# Patient Record
Sex: Male | Born: 1995 | Race: White | Hispanic: No | Smoking: Light tobacco smoker
Health system: Southern US, Community
[De-identification: ages and names within clinical notes are randomized; demographics above are authoritative.]

---
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
## 2018-01-17 ENCOUNTER — Other Ambulatory Visit: Payer: Self-pay

## 2018-01-17 ENCOUNTER — Emergency Department (HOSPITAL_COMMUNITY)
Admission: EM | Admit: 2018-01-17 | Discharge: 2018-01-17 | Disposition: A | Discharge status: Home / Self Care | Payer: BLUE CROSS/BLUE SHIELD | Attending: Emergency Medicine | Admitting: Emergency Medicine

## 2018-01-17 ENCOUNTER — Encounter (HOSPITAL_COMMUNITY): Payer: Self-pay | Admitting: Emergency Medicine

## 2018-01-17 DIAGNOSIS — Y929 Unspecified place or not applicable: Secondary | ICD-10-CM | POA: Diagnosis not present

## 2018-01-17 DIAGNOSIS — Y939 Activity, unspecified: Secondary | ICD-10-CM | POA: Insufficient documentation

## 2018-01-17 DIAGNOSIS — Z720 Tobacco use: Secondary | ICD-10-CM | POA: Insufficient documentation

## 2018-01-17 DIAGNOSIS — S61211A Laceration without foreign body of left index finger without damage to nail, initial encounter: Secondary | ICD-10-CM | POA: Diagnosis present

## 2018-01-17 DIAGNOSIS — W269XXA Contact with unspecified sharp object(s), initial encounter: Secondary | ICD-10-CM | POA: Diagnosis not present

## 2018-01-17 DIAGNOSIS — Y999 Unspecified external cause status: Secondary | ICD-10-CM | POA: Diagnosis not present

## 2018-01-17 DIAGNOSIS — Z23 Encounter for immunization: Secondary | ICD-10-CM | POA: Diagnosis not present

## 2018-01-17 MED ORDER — BACITRACIN-NEOMYCIN-POLYMYXIN 400-5-5000 EX OINT
TOPICAL_OINTMENT | Freq: Once | CUTANEOUS | Status: AC
Start: 2018-01-17 — End: 2018-01-17
  Administered 2018-01-17: 1 via TOPICAL
  Filled 2018-01-17: qty 1

## 2018-01-17 MED ORDER — TETANUS-DIPHTH-ACELL PERTUSSIS 5-2.5-18.5 LF-MCG/0.5 IM SUSP
0.5000 mL | Freq: Once | INTRAMUSCULAR | Status: AC
  Administered 2018-01-17: 0.5 mL via INTRAMUSCULAR
  Filled 2018-01-17: qty 0.5

## 2018-01-17 NOTE — Discharge Instructions (Addendum)
Please cleanse the wound daily with soap and water.  Apply Neosporin and a Band-Aid daily until the wound heals.  Your tetanus status was updated today.  Please update your medical records to indicate this tetanus status change.

## 2018-01-17 NOTE — ED Triage Notes (Signed)
Patient states he cut his finger (L middle) on a rusty piece of metal while working on a vehicle.

## 2018-01-18 NOTE — ED Provider Notes (Signed)
[facility]GENCY [facility]PARTMENT Provider Note   CSN: [id number] Arrival date & time: 01/17/18  1933     History   Chief Complaint Chief Complaint  Patient presents with  . Laceration    HPI Tony Mercado is a 22 y.o. male.  The history is provided by the patient.  Laceration   The incident occurred 3 to 5 hours ago. The laceration is located on the left hand (left middle finger). The laceration is 1 cm in size. Injury mechanism: rusty metal on old car. The pain is mild. The pain has been constant since onset. He reports no foreign bodies present. His tetanus status is out of date.    History reviewed. No pertinent past medical history.  There are no active problems to display for this patient.   History reviewed. No pertinent surgical history.      Home Medications    Prior to Admission medications   Medication Sig Start Date End Date Taking? Authorizing Provider  doxycycline (VIBRAMYCIN) 100 MG capsule Take 1 capsule (100 mg total) by mouth 2 (two) times daily. 07/11/15   Elson AreasSofia, Leslie K, PA-C    Family History History reviewed. No pertinent family history.  Social History Social History   Tobacco Use  . Smoking status: Light Tobacco Smoker    Packs/day: 0.25    Types: Cigarettes  . Smokeless tobacco: Never Used  Substance Use Topics  . Alcohol use: No  . Drug use: No     Allergies   Patient has no known allergies.   Review of Systems Review of Systems  Constitutional: Negative for activity change.       All ROS Neg except as noted in HPI  HENT: Negative for nosebleeds.   Eyes: Negative for photophobia and discharge.  Respiratory: Negative for cough, shortness of breath and wheezing.   Cardiovascular: Negative for chest pain and palpitations.  Gastrointestinal: Negative for abdominal pain and blood in stool.  Genitourinary: Negative for dysuria, frequency and hematuria.  Musculoskeletal: Negative for arthralgias, back pain and neck pain.    Skin: Negative.   Neurological: Negative for dizziness, seizures and speech difficulty.  Psychiatric/Behavioral: Negative for confusion and hallucinations.     Physical Exam Updated Vital Signs BP 138/74 (BP Location: Right Arm)   Pulse 67   Temp 97.9 F (36.6 C) (Oral)   Resp 18   Ht 6\' 1"  (1.854 m)   Wt 64.1 kg   SpO2 100%   BMI 18.65 kg/m   Physical Exam  Constitutional: He is oriented to person, place, and time. He appears well-developed and well-nourished.  Non-toxic appearance.  HENT:  Head: Normocephalic.  Right Ear: Tympanic membrane and external ear normal.  Left Ear: Tympanic membrane and external ear normal.  Eyes: Pupils are equal, round, and reactive to light. EOM and lids are normal.  Neck: Normal range of motion. Neck supple. Carotid bruit is not present.  Cardiovascular: Normal rate, regular rhythm, normal heart sounds, intact distal pulses and normal pulses.  Pulmonary/Chest: Breath sounds normal. No respiratory distress.  Abdominal: Soft. Bowel sounds are normal. There is no tenderness. There is no guarding.  Musculoskeletal: Normal range of motion.       Left hand: He exhibits tenderness and laceration. He exhibits normal range of motion, normal capillary refill and no deformity. Normal sensation noted. Normal strength noted.       Hands: Left middle finger has full flexion and extension. Good cap refill. Laceration is shallow. No sutures required.   Lymphadenopathy:  Head (right side): No submandibular adenopathy present.       Head (left side): No submandibular adenopathy present.    He has no cervical adenopathy.  Neurological: He is alert and oriented to person, place, and time. He has normal strength. No cranial nerve deficit or sensory deficit.  Skin: Skin is warm and dry.  Psychiatric: He has a normal mood and affect. His speech is normal.  Nursing note and vitals reviewed.    ED Treatments / Results  Labs (all labs ordered are listed,  but only abnormal results are displayed) Labs Reviewed - No data to display  EKG None  Radiology No results found.  Procedures Procedures (including critical care time)  Medications Ordered in ED Medications  neomycin-bacitracin-polymyxin (NEOSPORIN) ointment (1 application Topical Given 01/17/18 2127)  Tdap (BOOSTRIX) injection 0.5 mL (0.5 mLs Intramuscular Given 01/17/18 2127)     Initial Impression / Assessment and Plan / ED Course  I have reviewed the triage vital signs and the nursing notes.  Pertinent labs & imaging results that were available during my care of the patient were reviewed by me and considered in my medical decision making (see chart for details).       Final Clinical Impressions(s) / ED Diagnoses MDM  Vital signs stable. No neurovascular deficit noted of the left hand or middle finger. Tetanus updated. Wound cleansed and bandage applied. Pt to return If any sign of advancing infection.   Final diagnoses:  Laceration of left index finger without foreign body without damage to nail, initial encounter    ED Discharge Orders    None       Ivery Quale, PA-C 01/18/18 1137    Doug Sou, MD 01/20/18 0700

## 2018-03-06 ENCOUNTER — Emergency Department (HOSPITAL_COMMUNITY)
Admission: EM | Admit: 2018-03-06 | Discharge: 2018-03-06 | Disposition: A | Discharge status: Home / Self Care | Payer: BLUE CROSS/BLUE SHIELD | Attending: Emergency Medicine | Admitting: Emergency Medicine

## 2018-03-06 ENCOUNTER — Encounter (HOSPITAL_COMMUNITY): Payer: Self-pay

## 2018-03-06 ENCOUNTER — Other Ambulatory Visit: Payer: Self-pay

## 2018-03-06 DIAGNOSIS — R42 Dizziness and giddiness: Secondary | ICD-10-CM | POA: Insufficient documentation

## 2018-03-06 DIAGNOSIS — R55 Syncope and collapse: Secondary | ICD-10-CM

## 2018-03-06 DIAGNOSIS — Z720 Tobacco use: Secondary | ICD-10-CM | POA: Insufficient documentation

## 2018-03-06 LAB — I-STAT CHEM 8, ED
BUN: 17 mg/dL (ref 6–20)
CALCIUM ION: 1.23 mmol/L (ref 1.15–1.40)
Chloride: 101 mmol/L (ref 98–111)
Creatinine, Ser: 1 mg/dL (ref 0.61–1.24)
Glucose, Bld: 83 mg/dL (ref 70–99)
HCT: 47 % (ref 39.0–52.0)
HEMOGLOBIN: 16 g/dL (ref 13.0–17.0)
Potassium: 4 mmol/L (ref 3.5–5.1)
SODIUM: 139 mmol/L (ref 135–145)
TCO2: 29 mmol/L (ref 22–32)

## 2018-03-06 LAB — CBG MONITORING, ED: GLUCOSE-CAPILLARY: 80 mg/dL (ref 70–99)

## 2018-03-06 NOTE — Discharge Instructions (Addendum)
Your EKG and lab work are normal here without indication of diabetes Please establish primary care and recheck in the next 1 to 2 weeks

## 2018-03-06 NOTE — ED Triage Notes (Addendum)
Pt has been having near syncopal episodes with nausea x one month. Frequent urination x 3 days. Pt says he "gets really hyper when he eats sugar, then comes down and gets really tired". Pt says his family thinks he is diabetic. Pt has not seen PCP for this.

## 2018-03-06 NOTE — ED Provider Notes (Addendum)
[facility] Provider Note   CSN: [id number] Arrival date & time: 03/06/18  2154     History   Chief Complaint Chief Complaint  Patient presents with  . Near Syncope    HPI Franz DellBrandon Szczesniak is a 22 y.o. male.  HPI  22 year old male previously healthy presents today stating that he has some episodes of weakness and lightheadedness.  He thinks it may be he is diabetic. Friend at bedside states that sometimes he feels somewhat lightheaded and appears to be staring off into space.Marland Kitchen.  He states that he is aware of these episodes.  No report of seizure activity, weight change, appetite change, headache, head injury, chest pain, dyspnea, nausea, vomiting, or diaphoresis  History reviewed. No pertinent past medical history.  There are no active problems to display for this patient.   History reviewed. No pertinent surgical history.      Home Medications    Prior to Admission medications   Medication Sig Start Date End Date Taking? Authorizing Provider  doxycycline (VIBRAMYCIN) 100 MG capsule Take 1 capsule (100 mg total) by mouth 2 (two) times daily. 07/11/15   Elson AreasSofia, Leslie K, PA-C    Family History No family history on file.  Social History Social History   Tobacco Use  . Smoking status: Light Tobacco Smoker    Packs/day: 0.25    Types: Cigarettes  . Smokeless tobacco: Never Used  Substance Use Topics  . Alcohol use: No  . Drug use: No     Allergies   Patient has no known allergies.   Review of Systems Review of Systems  All other systems reviewed and are negative.    Physical Exam Updated Vital Signs BP (!) 117/93 (BP Location: Right Arm)   Pulse 67   Temp 97.7 F (36.5 C) (Oral)   Resp 16   Ht 1.829 m (6')   Wt 63.5 kg   SpO2 100%   BMI 18.99 kg/m   Physical Exam Vitals signs and nursing note reviewed.  HENT:     Head: Normocephalic.     Right Ear: Tympanic membrane normal.     Left Ear: Tympanic membrane normal.   Nose: Nose normal.     Mouth/Throat:     Mouth: Mucous membranes are moist.  Eyes:     Pupils: Pupils are equal, round, and reactive to light.  Neck:     Musculoskeletal: Normal range of motion and neck supple.  Cardiovascular:     Rate and Rhythm: Normal rate and regular rhythm.     Pulses: Normal pulses.     Heart sounds: Normal heart sounds.  Pulmonary:     Effort: Pulmonary effort is normal.  Abdominal:     General: Abdomen is flat.     Palpations: Abdomen is soft.  Musculoskeletal: Normal range of motion.  Skin:    General: Skin is warm and dry.     Capillary Refill: Capillary refill takes less than 2 seconds.  Neurological:     General: No focal deficit present.     Mental Status: He is alert. Mental status is at baseline. He is disoriented.  Psychiatric:        Mood and Affect: Mood normal.      ED Treatments / Results  Labs (all labs ordered are listed, but only abnormal results are displayed) Labs Reviewed  CBG MONITORING, ED    EKG EKG Interpretation  Date/Time:  Tuesday March 06 2018 22:33:29 EST Ventricular Rate:  61 PR Interval:  QRS Duration: 99 QT Interval:  384 QTC Calculation: 387 R Axis:   62 Text Interpretation:  Normal sinus rhythm Normal ECG Confirmed by Margarita Grizzleay, Daune Colgate [id number]) on 03/06/2018 10:44:55 PM   Radiology No results found.  Procedures Procedures (including critical care time)  Medications Ordered in ED Medications - No data to display   Initial Impression / Assessment and Plan / ED Course  I have reviewed the triage vital signs and the nursing notes.  Pertinent labs & imaging results that were available during my care of the patient were reviewed by me and considered in my medical decision making (see chart for details).    Plan EKG, orthostatics, i-STAT 8 Test normal plan discharged to follow-up with primary care Final Clinical Impressions(s) / ED Diagnoses   Final diagnoses:  Syncope, unspecified syncope type     ED Discharge Orders    None       Margarita Grizzleay, Nello Corro, MD 03/06/18 2245    Margarita Grizzleay, Fredi Geiler, MD 03/06/18 2247

## 2019-08-23 ENCOUNTER — Emergency Department (HOSPITAL_COMMUNITY)
Admission: EM | Admit: 2019-08-23 | Discharge: 2019-08-24 | Disposition: A | Discharge status: Home / Self Care | Payer: BC Managed Care – PPO | Attending: Emergency Medicine | Admitting: Emergency Medicine

## 2019-08-23 ENCOUNTER — Encounter (HOSPITAL_COMMUNITY): Payer: Self-pay

## 2019-08-23 ENCOUNTER — Emergency Department (HOSPITAL_COMMUNITY): Payer: BC Managed Care – PPO

## 2019-08-23 DIAGNOSIS — Z5321 Procedure and treatment not carried out due to patient leaving prior to being seen by health care provider: Secondary | ICD-10-CM | POA: Diagnosis not present

## 2019-08-23 DIAGNOSIS — Y9389 Activity, other specified: Secondary | ICD-10-CM | POA: Insufficient documentation

## 2019-08-23 DIAGNOSIS — Y92410 Unspecified street and highway as the place of occurrence of the external cause: Secondary | ICD-10-CM | POA: Diagnosis not present

## 2019-08-23 DIAGNOSIS — M545 Low back pain: Secondary | ICD-10-CM | POA: Diagnosis present

## 2019-08-23 DIAGNOSIS — Y999 Unspecified external cause status: Secondary | ICD-10-CM | POA: Insufficient documentation

## 2019-08-23 LAB — URINALYSIS, ROUTINE W REFLEX MICROSCOPIC
Bilirubin Urine: NEGATIVE
Glucose, UA: NEGATIVE mg/dL
Hgb urine dipstick: NEGATIVE
Ketones, ur: NEGATIVE mg/dL
Leukocytes,Ua: NEGATIVE
Nitrite: NEGATIVE
Protein, ur: 30 mg/dL — AB
Specific Gravity, Urine: 1.031 — ABNORMAL HIGH (ref 1.005–1.030)
pH: 5 (ref 5.0–8.0)
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
## 2019-08-24 NOTE — ED Notes (Signed)
Pt did not to wait and left.

## 2019-08-26 ENCOUNTER — Ambulatory Visit (HOSPITAL_COMMUNITY)
Admission: EM | Admit: 2019-08-26 | Discharge: 2019-08-26 | Disposition: A | Discharge status: Home / Self Care | Payer: BC Managed Care – PPO | Attending: Family Medicine | Admitting: Family Medicine

## 2019-08-26 ENCOUNTER — Other Ambulatory Visit: Payer: Self-pay

## 2019-08-26 ENCOUNTER — Encounter (HOSPITAL_COMMUNITY): Payer: Self-pay

## 2019-08-26 DIAGNOSIS — S39012A Strain of muscle, fascia and tendon of lower back, initial encounter: Secondary | ICD-10-CM

## 2019-08-26 MED ORDER — CYCLOBENZAPRINE HCL 5 MG PO TABS: 5.0000 mg | ORAL_TABLET | Freq: Two times a day (BID) | ORAL | 0 refills | Status: AC | PRN

## 2019-08-26 MED ORDER — IBUPROFEN 100 MG/5ML PO SUSP: 600.0000 mg | Freq: Three times a day (TID) | ORAL | 0 refills | Status: DC | PRN
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
## 2019-08-26 NOTE — Discharge Instructions (Addendum)
Use anti-inflammatories for pain/swelling. You may take up to 800 mg Ibuprofen every 8 hours with food. You may supplement Ibuprofen with Tylenol 500-1000 mg every 8 hours.  Gentle stretching Alternate ice and heat Follow up if not improving or worsening 
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
## 2019-08-29 ENCOUNTER — Other Ambulatory Visit: Payer: Self-pay

## 2019-08-29 ENCOUNTER — Ambulatory Visit (HOSPITAL_COMMUNITY)
Admission: EM | Admit: 2019-08-29 | Discharge: 2019-08-29 | Disposition: A | Discharge status: Home / Self Care | Payer: BC Managed Care – PPO | Attending: Family Medicine | Admitting: Family Medicine

## 2019-08-29 ENCOUNTER — Encounter (HOSPITAL_COMMUNITY): Payer: Self-pay | Admitting: Emergency Medicine

## 2019-08-29 DIAGNOSIS — M62830 Muscle spasm of back: Secondary | ICD-10-CM

## 2019-08-29 DIAGNOSIS — S39012A Strain of muscle, fascia and tendon of lower back, initial encounter: Secondary | ICD-10-CM | POA: Diagnosis not present

## 2019-08-29 MED ORDER — TRAMADOL HCL 50 MG PO TABS: 50.0000 mg | ORAL_TABLET | Freq: Four times a day (QID) | ORAL | 0 refills | Status: AC | PRN
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
## 2019-08-29 NOTE — Discharge Instructions (Signed)
Be aware, you have been prescribed pain medications that may cause drowsiness. Do not combine with alcohol or other illicit drugs. Please do not drive, operate heavy machinery, or take part in activities that require making important decisions while on this medication as your judgement may be clouded.  

## 2019-09-04 ENCOUNTER — Ambulatory Visit (HOSPITAL_COMMUNITY)
Admission: EM | Admit: 2019-09-04 | Discharge: 2019-09-04 | Disposition: A | Discharge status: Home / Self Care | Payer: BC Managed Care – PPO

## 2019-09-04 ENCOUNTER — Other Ambulatory Visit: Payer: Self-pay

## 2019-09-04 ENCOUNTER — Encounter (HOSPITAL_COMMUNITY): Payer: Self-pay

## 2019-09-04 DIAGNOSIS — S39012D Strain of muscle, fascia and tendon of lower back, subsequent encounter: Secondary | ICD-10-CM

## 2019-09-04 DIAGNOSIS — M62830 Muscle spasm of back: Secondary | ICD-10-CM

## 2019-09-04 NOTE — ED Triage Notes (Signed)
Pt needing return to work paperwork filled out. Was here on 6/24 for back pain, states pain is greatly improved.
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
## 2021-01-22 ENCOUNTER — Other Ambulatory Visit: Payer: Self-pay

## 2021-01-22 ENCOUNTER — Encounter (HOSPITAL_COMMUNITY): Payer: Self-pay | Admitting: Emergency Medicine

## 2021-01-22 ENCOUNTER — Ambulatory Visit (HOSPITAL_COMMUNITY)
Admission: EM | Admit: 2021-01-22 | Discharge: 2021-01-22 | Disposition: A | Discharge status: Home / Self Care | Payer: BC Managed Care – PPO | Attending: Physician Assistant | Admitting: Physician Assistant

## 2021-01-22 DIAGNOSIS — M25531 Pain in right wrist: Secondary | ICD-10-CM

## 2021-01-22 DIAGNOSIS — M654 Radial styloid tenosynovitis [de Quervain]: Secondary | ICD-10-CM

## 2021-01-22 MED ORDER — IBUPROFEN 100 MG/5ML PO SUSP: 600.0000 mg | Freq: Three times a day (TID) | ORAL | 0 refills | Status: AC | PRN

## 2021-01-22 NOTE — ED Provider Notes (Signed)
[facility]    CSN: [id number] Arrival date & time: 01/22/21  1736      History   Chief Complaint Chief Complaint  Patient presents with   Hand Pain    HPI Tony Mercado is a 25 y.o. male.   Patient presents today with a 1 day history of right wrist and hand pain.  He denies any known injury or increase in activity prior to symptom onset.  Reports pain is rated to his radial right wrist with radiation into the thenar eminence, rated 5/6 on a 0-10 pain scale, worse with certain movements, no limiting factors identified.  Denies any previous injury or surgery involving right hand or wrist.  He is right-handed.  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  He has difficulty taking pills so has not tried anything for his symptoms.  He denies any numbness or paresthesias.   History reviewed. No pertinent past medical history.  There are no problems to display for this patient.   History reviewed. No pertinent surgical history.     Home Medications    Prior to Admission medications   Medication Sig Start Date End Date Taking? Authorizing Provider  cyclobenzaprine (FLEXERIL) 5 MG tablet Take 1-2 tablets (5-10 mg total) by mouth 2 (two) times daily as needed for muscle spasms. 08/26/19   Wieters, Hallie C, PA-C  ibuprofen (ADVIL) 100 MG/5ML suspension Take 30-40 mLs (600-800 mg total) by mouth every 8 (eight) hours as needed. 01/22/21   Tyese Finken, Noberto Retort, PA-C  traMADol (ULTRAM) 50 MG tablet Take 1 tablet (50 mg total) by mouth every 6 (six) hours as needed. 08/29/19   Mardella Layman, MD    Family History History reviewed. No pertinent family history.  Social History Social History   Tobacco Use   Smoking status: Light Smoker    Packs/day: 0.25    Types: Cigarettes   Smokeless tobacco: Never  Vaping Use   Vaping Use: Some days  Substance Use Topics   Alcohol use: No   Drug use: No     Allergies   Patient has no known  allergies.   Review of Systems Review of Systems  Constitutional:  Positive for activity change. Negative for appetite change, fatigue and fever.  Respiratory:  Negative for cough and shortness of breath.   Cardiovascular:  Negative for chest pain.  Gastrointestinal:  Negative for abdominal pain, diarrhea, nausea and vomiting.  Musculoskeletal:  Positive for arthralgias. Negative for myalgias.  Neurological:  Negative for dizziness, light-headedness and headaches.    Physical Exam Triage Vital Signs ED Triage Vitals  Enc Vitals Group     BP 01/22/21 1848 119/70     Pulse Rate 01/22/21 1848 64     Resp 01/22/21 1848 16     Temp 01/22/21 1848 98 F (36.7 C)     Temp Source 01/22/21 1848 Oral     SpO2 01/22/21 1848 98 %     Weight --      Height --      Head Circumference --      Peak Flow --      Pain Score 01/22/21 1847 4     Pain Loc --      Pain Edu? --      Excl. in [place]? --    No data found.  Updated Vital Signs BP 119/70 (BP Location: Right Arm)   Pulse 64   Temp 98 F (36.7 C) (Oral)  Resp 16   SpO2 98%   Visual Acuity Right Eye Distance:   Left Eye Distance:   Bilateral Distance:    Right Eye Near:   Left Eye Near:    Bilateral Near:     Physical Exam Vitals reviewed.  Constitutional:      General: He is awake.     Appearance: Normal appearance. He is well-developed. He is not ill-appearing.     Comments: Very pleasant male appears stated age in no acute distress sitting comfortably in exam room  HENT:     Head: Normocephalic and atraumatic.  Cardiovascular:     Rate and Rhythm: Normal rate and regular rhythm.     Heart sounds: Normal heart sounds, S1 normal and S2 normal. No murmur heard. Pulmonary:     Effort: Pulmonary effort is normal.     Breath sounds: Normal breath sounds. No stridor. No wheezing, rhonchi or rales.     Comments: Clear to auscultation bilaterally Abdominal:     General: Bowel sounds are normal.     Palpations: Abdomen is  soft.     Tenderness: There is no abdominal tenderness.     Comments: Benign abdominal exam  Musculoskeletal:     Right hand: Swelling and tenderness present. No bony tenderness. Decreased range of motion. Normal strength. There is no disruption of two-point discrimination. Normal capillary refill.     Comments: Right hand/wrist: Tender palpation over radial wrist and along thenar eminence.  Mild swelling noted in same distributional.  Normal active range of motion of phalanges and wrist.  No bony tenderness.  Hand neurovascularly intact.  Negative Tinel's and Phalen's.  Positive Finkelstein on the right.  Neurological:     Mental Status: He is alert.  Psychiatric:        Behavior: Behavior is cooperative.     UC Treatments / Results  Labs (all labs ordered are listed, but only abnormal results are displayed) Labs Reviewed - No data to display  EKG   Radiology No results found.  Procedures Procedures (including critical care time)  Medications Ordered in UC Medications - No data to display  Initial Impression / Assessment and Plan / UC Course  I have reviewed the triage vital signs and the nursing notes.  Pertinent labs & imaging results that were available during my care of the patient were reviewed by me and considered in my medical decision making (see chart for details).     Concern for de Quervain's tenosynovitis given clinical presentation and positive Finkelstein on exam.  Will place patient in wrist brace and start anti-inflammatory medications regularly.  Liquid was sent per patient request.  He was instructed not to take additional NSAIDs with this medication due to risk of GI bleeding.  Recommended he use heat and gentle stretch for additional symptom relief.  Discussed that if symptoms not improving quickly with conservative treatment he should follow-up with sports medicine was given contact information for local provider.  Discussed alarm symptoms that warrant  emergent evaluation.  Strict return precautions given to which he expressed understanding.  Final Clinical Impressions(s) / UC Diagnoses   Final diagnoses:  De Quervain's tenosynovitis, right  Right wrist pain     Discharge Instructions      I believe that you have an inflamed tendon.  Please start ibuprofen every 8 hours for the next several days.  You should not take additional NSAIDs including aspirin, ibuprofen/Advil, naproxen/Aleve with this medication as cause stomach bleeding.  He can use heat  and elevation.  I recommend using brace to help support the wrist.  If symptoms or not improving follow-up with sports medicine.  If anything worsens please return for reevaluation.     ED Prescriptions     Medication Sig Dispense Auth. Provider   ibuprofen (ADVIL) 100 MG/5ML suspension Take 30-40 mLs (600-800 mg total) by mouth every 8 (eight) hours as needed. 1,000 mL Aariya Ferrick, Noberto Retort, PA-C      PDMP not reviewed this encounter.   Jeani Hawking, PA-C 01/22/21 1920

## 2021-01-22 NOTE — ED Triage Notes (Signed)
Pt presents with hand swelling and pain that started earlier today.

## 2021-01-22 NOTE — Discharge Instructions (Addendum)
I believe that you have an inflamed tendon.  Please start ibuprofen every 8 hours for the next several days.  You should not take additional NSAIDs including aspirin, ibuprofen/Advil, naproxen/Aleve with this medication as cause stomach bleeding.  He can use heat and elevation.  I recommend using brace to help support the wrist.  If symptoms or not improving follow-up with sports medicine.  If anything worsens please return for reevaluation.
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
## 2022-11-25 ENCOUNTER — Other Ambulatory Visit: Payer: Self-pay

## 2022-11-25 ENCOUNTER — Emergency Department (HOSPITAL_COMMUNITY): Payer: Self-pay

## 2022-11-25 ENCOUNTER — Emergency Department (HOSPITAL_COMMUNITY)
Admission: EM | Admit: 2022-11-25 | Discharge: 2022-11-25 | Disposition: A | Discharge status: Home / Self Care | Payer: Self-pay | Attending: Emergency Medicine | Admitting: Emergency Medicine

## 2022-11-25 ENCOUNTER — Encounter (HOSPITAL_COMMUNITY): Payer: Self-pay | Admitting: Emergency Medicine

## 2022-11-25 DIAGNOSIS — R0789 Other chest pain: Secondary | ICD-10-CM | POA: Insufficient documentation

## 2022-11-25 LAB — BASIC METABOLIC PANEL
Anion gap: 8 (ref 5–15)
BUN: 10 mg/dL (ref 6–20)
CO2: 26 mmol/L (ref 22–32)
Calcium: 9.1 mg/dL (ref 8.9–10.3)
Chloride: 102 mmol/L (ref 98–111)
Creatinine, Ser: 0.99 mg/dL (ref 0.61–1.24)
GFR, Estimated: >60 mL/min (ref >60)
Glucose, Bld: 116 mg/dL — ABNORMAL HIGH (ref 70–99)
Potassium: 4 mmol/L (ref 3.5–5.1)
Sodium: 136 mmol/L (ref 135–145)

## 2022-11-25 LAB — CBC
HCT: 44.1 % (ref 39.0–52.0)
Hemoglobin: 14.6 g/dL (ref 13.0–17.0)
MCH: 28.3 pg (ref 26.0–34.0)
MCHC: 33.1 g/dL (ref 30.0–36.0)
MCV: 85.6 fL (ref 80.0–100.0)
Platelets: 193 10*3/uL (ref 150–400)
RBC: 5.15 MIL/uL (ref 4.22–5.81)
RDW: 12.9 % (ref 11.5–15.5)
WBC: 8.1 10*3/uL (ref 4.0–10.5)
nRBC: 0 % (ref 0.0–0.2)

## 2022-11-25 LAB — TROPONIN I (HIGH SENSITIVITY): Troponin I (High Sensitivity): 3 ng/L (ref <18)

## 2022-11-25 MED ORDER — IBUPROFEN 100 MG/5ML PO SUSP
600.0000 mg | Freq: Once | ORAL | Status: AC
  Administered 2022-11-25: 600 mg via ORAL
  Filled 2022-11-25: qty 30

## 2022-11-25 NOTE — ED Triage Notes (Signed)
Pt. Stated, Ive had pain in my chest and it jumps from one side to the other.

## 2022-11-25 NOTE — Discharge Instructions (Signed)
You may use over-the-counter ibuprofen and/or Tylenol to help with your pain.  Ibuprofen is preferred as it is an anti-inflammatory.  Your workup today is reassuring, but if you develop new or worsening or uncontrolled chest pain, trouble breathing, or any other new/concerning symptoms then return to the ER for evaluation.  Otherwise he should follow-up with a primary care physician for general preventative health care and follow-up.

## 2022-11-25 NOTE — ED Provider Notes (Addendum)
[place] EMERGENCY DEPARTMENT AT [facility] Provider Note   CSN: [id number] Arrival date & time: 11/25/22  0920     History  Chief Complaint  Patient presents with   Chest Pain    Tony Mercado is a 27 y.o. male.  HPI 27 year old male presents with chest pain.  Started off yesterday but was not too bad.  The pain is in his middle of his chest and then radiates out towards the left side.  The pain is worse with inspiration but also any movement, particularly laying flat.  He denies any cough, fever, shortness of breath.  No recent leg swelling or travel.  No recent illness.  He has not taken anything for the pain and states that he has a hard time swallowing pills at baseline.  Home Medications Prior to Admission medications   Medication Sig Start Date End Date Taking? Authorizing Provider  cyclobenzaprine (FLEXERIL) 5 MG tablet Take 1-2 tablets (5-10 mg total) by mouth 2 (two) times daily as needed for muscle spasms. 08/26/19   Wieters, Hallie C, PA-C  ibuprofen (ADVIL) 100 MG/5ML suspension Take 30-40 mLs (600-800 mg total) by mouth every 8 (eight) hours as needed. 01/22/21   Raspet, Noberto Retort, PA-C  traMADol (ULTRAM) 50 MG tablet Take 1 tablet (50 mg total) by mouth every 6 (six) hours as needed. 08/29/19   Mardella Layman, MD      Allergies    Patient has no known allergies.    Review of Systems   Review of Systems  Constitutional:  Negative for fever.  Respiratory:  Negative for cough and shortness of breath.   Cardiovascular:  Positive for chest pain. Negative for leg swelling.    Physical Exam Updated Vital Signs BP 115/69   Pulse (!) 53   Temp 98.1 F (36.7 C)   Resp 16   Ht 6' (1.829 m)   Wt 63.5 kg   SpO2 96%   BMI 18.99 kg/m  Physical Exam Vitals and nursing note reviewed.  Constitutional:      Appearance: He is well-developed.  HENT:     Head: Normocephalic and atraumatic.  Cardiovascular:     Rate and Rhythm: Normal rate and regular rhythm.      Heart sounds: Normal heart sounds.  Pulmonary:     Effort: Pulmonary effort is normal.     Breath sounds: Normal breath sounds.  Chest:     Chest wall: Tenderness present.    Abdominal:     General: There is no distension.  Musculoskeletal:     Right lower leg: No edema.     Left lower leg: No edema.  Skin:    General: Skin is warm and dry.  Neurological:     Mental Status: He is alert.     ED Results / Procedures / Treatments   Labs (all labs ordered are listed, but only abnormal results are displayed) Labs Reviewed  BASIC METABOLIC PANEL - Abnormal; Notable for the following components:      Result Value   Glucose, Bld 116 (*)    All other components within normal limits  CBC  TROPONIN I (HIGH SENSITIVITY)    EKG EKG Interpretation Date/Time:  Friday November 25 2022 09:33:16 EDT Ventricular Rate:  71 PR Interval:  146 QRS Duration:  96 QT Interval:  368 QTC Calculation: 399 R Axis:   86  Text Interpretation: Normal sinus rhythm no acute ST/T changes similar to 2019 Confirmed by Pricilla Loveless [phone]) on 11/25/2022 10:39:48  AM  Radiology DG Chest 2 View  Result Date: 11/25/2022 CLINICAL DATA:  Chest pain. EXAM: CHEST - 2 VIEW COMPARISON:  None Available. FINDINGS: Bilateral lung fields are clear. Bilateral costophrenic angles are clear. Normal cardio-mediastinal silhouette. No acute osseous abnormalities. The soft tissues are within normal limits. IMPRESSION: No active cardiopulmonary disease. Electronically Signed   By: Jules Schick M.D.   On: 11/25/2022 10:43    Procedures Procedures    Medications Ordered in ED Medications  ibuprofen (ADVIL) 100 MG/5ML suspension 600 mg (600 mg Oral Given 11/25/22 1102)    ED Course/ Medical Decision Making/ A&P                                 Medical Decision Making Amount and/or Complexity of Data Reviewed Labs: ordered.    Details: Normal troponin after 4+ hours of worsening symptoms and after over a day  of symptoms. Radiology: ordered and independent interpretation performed.    Details: No pneumothorax ECG/medicine tests: ordered and independent interpretation performed.    Details: No ischemia   Patient presents with chest wall pain.  While it is pleuritic, it also worsens with any type of movement or palpation.  No specific risk factors for PE and is otherwise low risk and PERC negative.  I do not think further workup is needed as I think this is unlikely to be PE.  Doubt ACS and he had symptoms since yesterday and while it did worsen this morning it has been over 4 hours since his pain got worse and his troponin is negative.  I do not think further troponins are needed given the very low suspicion for ACS.  Likely this is chest wall in etiology, like a costochondritis.  EKG not consistent with pericarditis.  Will recommend NSAIDs and have him follow-up with PCP.  Will give return precautions.        Final Clinical Impression(s) / ED Diagnoses Final diagnoses:  Chest wall pain    Rx / DC Orders ED Discharge Orders     None         Pricilla Loveless, MD 11/25/22 1154    Pricilla Loveless, MD 11/25/22 1155

## 2024-01-13 ENCOUNTER — Encounter (HOSPITAL_BASED_OUTPATIENT_CLINIC_OR_DEPARTMENT_OTHER): Payer: Self-pay | Admitting: Emergency Medicine

## 2024-01-13 ENCOUNTER — Emergency Department (HOSPITAL_BASED_OUTPATIENT_CLINIC_OR_DEPARTMENT_OTHER): Payer: Self-pay

## 2024-01-13 ENCOUNTER — Emergency Department (HOSPITAL_BASED_OUTPATIENT_CLINIC_OR_DEPARTMENT_OTHER)
Admission: EM | Admit: 2024-01-13 | Discharge: 2024-01-13 | Disposition: A | Discharge status: Home / Self Care | Payer: Self-pay

## 2024-01-13 DIAGNOSIS — R079 Chest pain, unspecified: Secondary | ICD-10-CM

## 2024-01-13 DIAGNOSIS — R0602 Shortness of breath: Secondary | ICD-10-CM | POA: Insufficient documentation

## 2024-01-13 DIAGNOSIS — R0789 Other chest pain: Secondary | ICD-10-CM | POA: Insufficient documentation

## 2024-01-13 LAB — BASIC METABOLIC PANEL WITH GFR
Anion gap: 15 (ref 5–15)
BUN: 12 mg/dL (ref 6–20)
CO2: 22 mmol/L (ref 22–32)
Calcium: 9.3 mg/dL (ref 8.9–10.3)
Chloride: 103 mmol/L (ref 98–111)
Creatinine, Ser: 0.95 mg/dL (ref 0.61–1.24)
GFR, Estimated: >60 mL/min (ref >60)
Glucose, Bld: 122 mg/dL — ABNORMAL HIGH (ref 70–99)
Potassium: 3.2 mmol/L — ABNORMAL LOW (ref 3.5–5.1)
Sodium: 139 mmol/L (ref 135–145)

## 2024-01-13 LAB — CBC
HCT: 42.1 % (ref 39.0–52.0)
Hemoglobin: 14.8 g/dL (ref 13.0–17.0)
MCH: 28.8 pg (ref 26.0–34.0)
MCHC: 35.2 g/dL (ref 30.0–36.0)
MCV: 82.1 fL (ref 80.0–100.0)
Platelets: 181 K/uL (ref 150–400)
RBC: 5.13 MIL/uL (ref 4.22–5.81)
RDW: 12.6 % (ref 11.5–15.5)
WBC: 9.4 K/uL (ref 4.0–10.5)
nRBC: 0 % (ref 0.0–0.2)

## 2024-01-13 LAB — TROPONIN T, HIGH SENSITIVITY: Troponin T High Sensitivity: <15 ng/L (ref 0–19)

## 2024-01-13 LAB — D-DIMER, QUANTITATIVE: D-Dimer, Quant: <0.27 ug{FEU}/mL (ref 0.00–0.50)

## 2024-01-13 MED ORDER — ALBUTEROL SULFATE HFA 108 (90 BASE) MCG/ACT IN AERS
2.0000 | INHALATION_SPRAY | Freq: Once | RESPIRATORY_TRACT | Status: AC
Start: 2024-01-13 — End: 2024-01-13
  Administered 2024-01-13: 2 via RESPIRATORY_TRACT
  Filled 2024-01-13: qty 6.7

## 2024-01-13 NOTE — ED Provider Notes (Signed)
 [place] EMERGENCY DEPARTMENT AT [facility] Provider Note   CSN: [id number] Arrival date & time: 01/13/24  9345     Patient presents with: Shortness of Breath and URI   Tony Mercado is a 28 y.o. male.   This is a 28 year old male presenting emergency department chest pain shortness of breath.  Reports symptoms started 2 days ago been largely constant since that time.  Reports pain to central slightly left-sided chest with deep breathing.  Pain is somewhat exacerbated by movement.  Feels dyspneic as well, but medical sentences.  Denies URI symptoms.  No cough.  No nausea no vomiting.  Does not smoke, he used to vape but no longer does.  No history of asthma or reactive airway disease   Shortness of Breath URI      Prior to Admission medications   Medication Sig Start Date End Date Taking? Authorizing Provider  cyclobenzaprine  (FLEXERIL ) 5 MG tablet Take 1-2 tablets (5-10 mg total) by mouth 2 (two) times daily as needed for muscle spasms. 08/26/19   Wieters, Hallie C, PA-C  ibuprofen  (ADVIL ) 100 MG/5ML suspension Take 30-40 mLs (600-800 mg total) by mouth every 8 (eight) hours as needed. 01/22/21   Raspet, Erin K, PA-C  traMADol  (ULTRAM ) 50 MG tablet Take 1 tablet (50 mg total) by mouth every 6 (six) hours as needed. 08/29/19   Rolinda Rogue, MD    Allergies: Patient has no known allergies.    Review of Systems  Respiratory:  Positive for shortness of breath.     Updated Vital Signs BP 129/87 (BP Location: Left Arm)   Pulse 64   Temp 97.9 F (36.6 C) (Oral)   Resp 20   Ht 6' (1.829 m)   Wt 62.6 kg   SpO2 100%   BMI 18.72 kg/m   Physical Exam Vitals and nursing note reviewed.  Constitutional:      General: He is not in acute distress.    Appearance: He is not toxic-appearing.  HENT:     Head: Normocephalic.  Cardiovascular:     Rate and Rhythm: Normal rate and regular rhythm.  Pulmonary:     Effort: Pulmonary effort is normal. No tachypnea or  respiratory distress.     Breath sounds: No wheezing, rhonchi or rales.  Chest:     Chest wall: Tenderness present.  Abdominal:     Palpations: Abdomen is soft.  Musculoskeletal:     Cervical back: Normal range of motion.     Right lower leg: No edema.     Left lower leg: No edema.  Skin:    General: Skin is warm.     Capillary Refill: Capillary refill takes less than 2 seconds.  Neurological:     Mental Status: He is alert and oriented to person, place, and time.  Psychiatric:        Mood and Affect: Mood normal.        Behavior: Behavior normal.     (all labs ordered are listed, but only abnormal results are displayed) Labs Reviewed  BASIC METABOLIC PANEL WITH GFR - Abnormal; Notable for the following components:      Result Value   Potassium 3.2 (*)    Glucose, Bld 122 (*)    All other components within normal limits  CBC  D-DIMER, QUANTITATIVE  TROPONIN T, HIGH SENSITIVITY    EKG: EKG Interpretation Date/Time:  Saturday January 13 2024 07:25:49 EST Ventricular Rate:  66 PR Interval:  163 QRS Duration:  93 QT  Interval:  358 QTC Calculation: 375 R Axis:   69  Text Interpretation: Sinus rhythm ST elev, probable normal early repol pattern Confirmed by Neysa Clap [phone]) on 01/13/2024 8:02:36 AM  Radiology: DG Chest 2 View Result Date: 01/13/2024 EXAM: 2 VIEW(S) XRAY OF THE CHEST 01/13/2024 07:45:00 AM COMPARISON: 11/25/2022 CLINICAL HISTORY: chest pain FINDINGS: LUNGS AND PLEURA: No focal pulmonary opacity. No pulmonary edema. No pleural effusion. No pneumothorax. HEART AND MEDIASTINUM: No acute abnormality of the cardiac and mediastinal silhouettes. BONES AND SOFT TISSUES: No acute osseous abnormality. IMPRESSION: 1. No acute cardiopulmonary process detected. Electronically signed by: Waddell Calk MD 01/13/2024 07:59 AM EST RP Workstation: [id number]     Procedures   Medications Ordered in the ED  albuterol (VENTOLIN HFA) 108 (90 Base) MCG/ACT inhaler 2 puff  (2 puffs Inhalation Given 01/13/24 0733)    Clinical Course as of 01/13/24 0807  Sat Jan 13, 2024  0800 Basic metabolic panel(!) Low potassium.  But no other metabolic derangements. [TY]  0800 Troponin T High Sensitivity: <15 ACS and cardiac etiology felt to be less likely given negative troponin and duration of symptoms. [TY]  [id number] D-Dimer, Quant: <0.27 Low risk for PE, D-dimer negative [TY]  0801 CBC No leukocytosis to suggest systemic infection [TY]  0801 Does not appear to have overt pneumonia.  No pneumothorax on my independent review of his chest x-ray [TY]  0802 DG Chest 2 View IMPRESSION: 1. No acute cardiopulmonary process detected.   [TY]  0802 EKG 12-Lead EKG without ischemic changes on my independent interpretation [TY]  0806 Patient's workup is negative.  No significant improvement with albuterol.  Again stable vitals not in distress saturating 100%.  Negative workup.  At this point I think his symptoms are likely secondary to chest wall/MSK etiology such as costochondritis.  Discussed supportive care.  Encouraged him to find a primary doctor to follow-up with. [TY]    Clinical Course User Index [TY] Neysa Clap PARAS, DO                                 Medical Decision Making Is a 28 year old male presenting emergency department with chest pain and shortness of breath.  He is afebrile nontachycardic, normotensive.  Maintaining oxygen saturation on room air.  On exam he is not.  Or distress.  Has clear lungs.  Saturating 100% on room air.  He reports significant family cardiac history at Male Minish age which she is concerned about.  Unlikely, but reports that he does not have a primary doctor or anyone to follow-up with.  Will get screening labs, chest x-ray EKG.  He is low risk for PE, but given his pleuritic type chest pain will get D-dimer to exclude PE.  I suspect a URI/bronchitis type clinical picture, will trial albuterol for symptomatic relief.  Amount and/or Complexity of  Data Reviewed External Data Reviewed:     Details: Per chart review does appear that he has presented to the emergency department close to a year ago with similar sort of symptoms and diagnosed with costochondritis. Labs: ordered. Decision-making details documented in ED Course. Radiology: ordered and independent interpretation performed. Decision-making details documented in ED Course. ECG/medicine tests: ordered and independent interpretation performed. Decision-making details documented in ED Course.  Risk Prescription drug management. Decision regarding hospitalization. Diagnosis or treatment significantly limited by social determinants of health. Risk Details: Poor access health care       Final diagnoses:  None    ED Discharge Orders     None          Neysa Caron PARAS, [place] 01/13/24 9192

## 2024-01-13 NOTE — Discharge Instructions (Signed)
 Take over-the-counter medications for pain such as Tylenol or ibuprofen .  Please follow-up with primary doctor.  Return for fevers, chills, lightheadedness, passout, palpitations, worsening chest pain, difficulty breathing or any new or worsening symptoms that are concerning to you.

## 2024-01-13 NOTE — ED Triage Notes (Addendum)
 Pt states when breathing feels like not getting enough air. But when yawns feels like is getting enough. Sharp pain when taking deep breath. X 2 days. Fever yesterday, body aches and fatigue.
